# Patient Record
Sex: Female | Born: 1985 | Hispanic: Yes | Marital: Single | Smoking: Never smoker
Health system: Southern US, Community
[De-identification: ages and names within clinical notes are randomized; demographics above are authoritative.]

## PROBLEM LIST (undated history)

## (undated) DIAGNOSIS — E119 Type 2 diabetes mellitus without complications: Secondary | ICD-10-CM

## (undated) HISTORY — PX: APPENDECTOMY: SHX54

## (undated) HISTORY — PX: CHOLECYSTECTOMY: SHX55

---

## 2015-10-30 ENCOUNTER — Encounter: Payer: Self-pay | Admitting: Emergency Medicine

## 2015-10-30 ENCOUNTER — Emergency Department
Admission: EM | Admit: 2015-10-30 | Discharge: 2015-10-31 | Disposition: A | Discharge status: Home / Self Care | Payer: Self-pay | Attending: Emergency Medicine | Admitting: Emergency Medicine

## 2015-10-30 DIAGNOSIS — Z7984 Long term (current) use of oral hypoglycemic drugs: Secondary | ICD-10-CM | POA: Insufficient documentation

## 2015-10-30 DIAGNOSIS — R109 Unspecified abdominal pain: Secondary | ICD-10-CM

## 2015-10-30 DIAGNOSIS — R102 Pelvic and perineal pain: Secondary | ICD-10-CM

## 2015-10-30 DIAGNOSIS — E119 Type 2 diabetes mellitus without complications: Secondary | ICD-10-CM | POA: Insufficient documentation

## 2015-10-30 DIAGNOSIS — R112 Nausea with vomiting, unspecified: Secondary | ICD-10-CM | POA: Insufficient documentation

## 2015-10-30 DIAGNOSIS — R1030 Lower abdominal pain, unspecified: Secondary | ICD-10-CM | POA: Insufficient documentation

## 2015-10-30 DIAGNOSIS — R1010 Upper abdominal pain, unspecified: Secondary | ICD-10-CM | POA: Insufficient documentation

## 2015-10-30 DIAGNOSIS — R51 Headache: Secondary | ICD-10-CM | POA: Insufficient documentation

## 2015-10-30 HISTORY — DX: Type 2 diabetes mellitus without complications: E11.9

## 2015-10-30 LAB — COMPREHENSIVE METABOLIC PANEL
ALK PHOS: 71 U/L (ref 38–126)
ALT: 18 U/L (ref 14–54)
AST: 17 U/L (ref 15–41)
Albumin: 3.4 g/dL — ABNORMAL LOW (ref 3.5–5.0)
Anion gap: 3 — ABNORMAL LOW (ref 5–15)
BILIRUBIN TOTAL: 0.5 mg/dL (ref 0.3–1.2)
BUN: 11 mg/dL (ref 6–20)
CALCIUM: 9.1 mg/dL (ref 8.9–10.3)
CO2: 31 mmol/L (ref 22–32)
CREATININE: 0.69 mg/dL (ref 0.44–1.00)
Chloride: 104 mmol/L (ref 101–111)
Glucose, Bld: 104 mg/dL — ABNORMAL HIGH (ref 65–99)
Potassium: 3.8 mmol/L (ref 3.5–5.1)
Sodium: 138 mmol/L (ref 135–145)
Total Protein: 7.7 g/dL (ref 6.5–8.1)

## 2015-10-30 LAB — URINALYSIS COMPLETE WITH MICROSCOPIC (ARMC ONLY)
Bacteria, UA: NONE SEEN
Bilirubin Urine: NEGATIVE
Glucose, UA: NEGATIVE mg/dL
Hgb urine dipstick: NEGATIVE
KETONES UR: NEGATIVE mg/dL
Leukocytes, UA: NEGATIVE
Nitrite: NEGATIVE
PH: 6 (ref 5.0–8.0)
Protein, ur: NEGATIVE mg/dL
Specific Gravity, Urine: 1.021 (ref 1.005–1.030)

## 2015-10-30 LAB — LIPASE, BLOOD: Lipase: 23 U/L (ref 11–51)

## 2015-10-30 LAB — CBC
HCT: 39 % (ref 35.0–47.0)
Hemoglobin: 12.9 g/dL (ref 12.0–16.0)
MCH: 28.6 pg (ref 26.0–34.0)
MCHC: 33.1 g/dL (ref 32.0–36.0)
MCV: 86.2 fL (ref 80.0–100.0)
PLATELETS: 294 10*3/uL (ref 150–440)
RBC: 4.52 MIL/uL (ref 3.80–5.20)
RDW: 14 % (ref 11.5–14.5)
WBC: 14.2 10*3/uL — AB (ref 3.6–11.0)

## 2015-10-30 LAB — POCT PREGNANCY, URINE: Preg Test, Ur: NEGATIVE

## 2015-10-30 NOTE — ED Triage Notes (Signed)
Patient ambulatory to triage with steady gait, without difficulty or distress noted; pt reports lower abd pain with N/V x 3 days

## 2015-10-30 NOTE — ED Notes (Signed)
Pt reports she has abd pain for 3 days.  Pt vomited x 1 today.  No diarrhea.  No vag bleeding.  No fever.  No back pain.  Pt also has a headache.  Pt alert.  Speech clear.   md in with pt

## 2015-10-31 ENCOUNTER — Encounter: Payer: Self-pay | Admitting: Radiology

## 2015-10-31 ENCOUNTER — Emergency Department: Payer: Self-pay

## 2015-10-31 LAB — CHLAMYDIA/NGC RT PCR (ARMC ONLY)
CHLAMYDIA TR: NOT DETECTED
N GONORRHOEAE: NOT DETECTED

## 2015-10-31 MED ORDER — ACETAMINOPHEN 500 MG PO TABS: 1000.0000 mg | ORAL_TABLET | Freq: Once | ORAL | Status: DC

## 2015-10-31 MED ORDER — ONDANSETRON HCL 4 MG/2ML IJ SOLN
4.0000 mg | Freq: Once | INTRAMUSCULAR | Status: AC
  Administered 2015-10-31: 4 mg via INTRAVENOUS
  Filled 2015-10-31: qty 2

## 2015-10-31 MED ORDER — DEXTROSE 5 % IV SOLN
1.0000 g | Freq: Once | INTRAVENOUS | Status: AC
  Administered 2015-10-31: 1 g via INTRAVENOUS
  Filled 2015-10-31: qty 10

## 2015-10-31 MED ORDER — IOPAMIDOL (ISOVUE-300) INJECTION 61%
30.0000 mL | Freq: Once | INTRAVENOUS | Status: AC | PRN
  Administered 2015-10-31: 30 mL via ORAL

## 2015-10-31 MED ORDER — MORPHINE SULFATE (PF) 4 MG/ML IV SOLN
INTRAVENOUS | Status: AC
  Administered 2015-10-31: 4 mg
  Filled 2015-10-31: qty 1

## 2015-10-31 MED ORDER — DOXYCYCLINE HYCLATE 100 MG PO TABS
100.0000 mg | ORAL_TABLET | Freq: Once | ORAL | Status: AC
  Administered 2015-10-31: 100 mg via ORAL
  Filled 2015-10-31: qty 1

## 2015-10-31 MED ORDER — IOPAMIDOL (ISOVUE-300) INJECTION 61%
125.0000 mL | Freq: Once | INTRAVENOUS | Status: AC | PRN
  Administered 2015-10-31: 125 mL via INTRAVENOUS

## 2015-10-31 MED ORDER — GI COCKTAIL ~~LOC~~
30.0000 mL | Freq: Once | ORAL | Status: AC
  Administered 2015-10-31: 30 mL via ORAL
  Filled 2015-10-31: qty 30

## 2015-10-31 MED ORDER — MORPHINE SULFATE (PF) 4 MG/ML IV SOLN
4.0000 mg | Freq: Once | INTRAVENOUS | Status: AC
  Administered 2015-10-31: 4 mg via INTRAVENOUS
  Filled 2015-10-31: qty 1

## 2015-10-31 NOTE — ED Provider Notes (Signed)
[facility] Emergency Department Provider Note   ____________________________________________   First MD Initiated Contact with Patient 10/30/15 2352     (approximate)  I have reviewed the triage vital signs and the nursing notes.   HISTORY  Chief Complaint Abdominal Pain  HPI Pamela Neal is a 30 y.o. female patient complains of 3 days of nausea and vomiting and headache. She says her belly hurts in the upper abdomen and lower abdomen. It just aches. He denies any diarrhea. She denies any fever. She is not having any coughing. The pain is moderate to severe in nature nothing seems to make it better or worse although it is worse if I palpated.   Past Medical History:  Diagnosis Date  . Diabetes mellitus without complication (HCC)     There are no active problems to display for this patient.   Past Surgical History:  Procedure Laterality Date  . APPENDECTOMY    . CESAREAN SECTION    . CHOLECYSTECTOMY      Prior to Admission medications   Medication Sig Start Date End Date Taking? Authorizing Provider  metFORMIN (GLUCOPHAGE) 500 MG tablet Take 500 mg by mouth 2 (two) times daily with a meal.   Yes Historical Provider, MD    Allergies Review of patient's allergies indicates no known allergies.  No family history on file.  Social History Social History  Substance Use Topics  . Smoking status: Never Smoker  . Smokeless tobacco: Never Used  . Alcohol use No    Review of Systems Constitutional: No fever/chills Eyes: No visual changes. ENT: No sore throat. Cardiovascular: Denies chest pain. Respiratory: Denies shortness of breath. Gastrointestinal: See history of present illness Genitourinary: Negative for dysuria. Musculoskeletal: Negative for back pain. Skin: Negative for rash. Neurological: Negative for focal weakness or numbness.  10-point ROS otherwise negative.  ____________________________________________   PHYSICAL  EXAM:  VITAL SIGNS: ED Triage Vitals  Enc Vitals Group     BP 10/30/15 2118 121/68     Pulse Rate 10/30/15 2118 83     Resp 10/30/15 2118 (!) 22     Temp 10/30/15 2118 98.1 F (36.7 C)     Temp Source 10/30/15 2118 Oral     SpO2 10/30/15 2118 98 %     Weight 10/30/15 2116 287 lb (130.2 kg)     Height 10/30/15 2116 5\' 2"  (1.575 m)     Head Circumference --      Peak Flow --      Pain Score 10/30/15 2116 10     Pain Loc --      Pain Edu? --      Excl. in GC? --     Constitutional: Alert and oriented. Well appearing and in no acute distress. Eyes: Conjunctivae are normal. PERRL. EOMI. Head: Atraumatic. Nose: No congestion/rhinnorhea. Mouth/Throat: Mucous membranes are moist.  Oropharynx non-erythematous. Neck: No stridor.   Cardiovascular: Normal rate, regular rhythm. Grossly normal heart sounds.  Good peripheral circulation. Respiratory: Normal respiratory effort.  No retractions. Lungs CTAB. Gastrointestinal: Soft and Tender to palpation percussion in the upper abdomen and in the lower abdomen not as much in the middle abdomen.. Palpation reproduces her pain. No distention. No abdominal bruits. No CVA tenderness. Musculoskeletal: No lower extremity tenderness nor edema.  No joint effusions. Neurologic:  Normal speech and language. No gross focal neurologic deficits are appreciated. No gait instability. Skin:  Skin is warm, dry and intact. No rash noted. Psychiatric: Mood and affect are normal. Speech  and behavior are normal.  ____________________________________________   LABS (all labs ordered are listed, but only abnormal results are displayed)  Labs Reviewed  COMPREHENSIVE METABOLIC PANEL - Abnormal; Notable for the following:       Result Value   Glucose, Bld 104 (*)    Albumin 3.4 (*)    Anion gap 3 (*)    All other components within normal limits  CBC - Abnormal; Notable for the following:    WBC 14.2 (*)    All other components within normal limits  URINALYSIS  COMPLETEWITH MICROSCOPIC ([facility] ONLY) - Abnormal; Notable for the following:    Color, Urine YELLOW (*)    APPearance CLEAR (*)    Squamous Epithelial / LPF 0-5 (*)    All other components within normal limits  CHLAMYDIA/NGC RT PCR ([facility] ONLY)  LIPASE, BLOOD  POCT PREGNANCY, URINE   ____________________________________________  EKG   ____________________________________________  RADIOLOGY CLINICAL DATA:  30 year old female with abdominal pain.  EXAM: CT ABDOMEN AND PELVIS WITH CONTRAST  TECHNIQUE: Multidetector CT imaging of the abdomen and pelvis was performed using the standard protocol following bolus administration of intravenous contrast.  CONTRAST:  ISOVUE-300 IOPAMIDOL (ISOVUE-300) INJECTION 61%  COMPARISON:  None.  FINDINGS: Lower chest: The visualized lung bases are clear. No intra-abdominal free air or free fluid.  Hepatobiliary: Cholecystectomy. Diffuse fatty infiltration of the liver. No biliary ductal dilatation.  Pancreas: Unremarkable. No pancreatic ductal dilatation or surrounding inflammatory changes.  Spleen: Normal in size without focal abnormality.  Adrenals/Urinary Tract: Adrenal glands are unremarkable. Kidneys are normal, without renal calculi, focal lesion, or hydronephrosis. Bladder is unremarkable.  Stomach/Bowel: There is no evidence of bowel obstruction or active inflammation. Appendectomy.  Vascular/Lymphatic: No significant vascular findings are present. No enlarged abdominal or pelvic lymph nodes.  Reproductive: The uterus and ovaries are grossly unremarkable.  Other: Midline vertical anterior pelvic wall incisional scar. No fluid collection.  Musculoskeletal: No acute or significant osseous findings.  IMPRESSION: No acute intra-abdominal pelvic pathology.   Electronically Signed   By: Elgie Collard M.D.   On: 10/31/2015 01:57  _____________Study Result   CLINICAL DATA:   30 year old female with abdominal pain.  EXAM: TRANSABDOMINAL AND TRANSVAGINAL ULTRASOUND OF PELVIS  DOPPLER ULTRASOUND OF OVARIES  TECHNIQUE: Both transabdominal and transvaginal ultrasound examinations of the pelvis were performed. Transabdominal technique was performed for global imaging of the pelvis including uterus, ovaries, adnexal regions, and pelvic cul-de-sac.  It was necessary to proceed with endovaginal exam following the transabdominal exam to visualize the endometrium and ovaries. Color and duplex Doppler ultrasound was utilized to evaluate blood flow to the ovaries.  COMPARISON:  Abdominal CT dated 10/31/2015  FINDINGS: Uterus  Measurements: 9.6 x 3.9 x 6.2 cm. No fibroids or other mass visualized.  Endometrium  Thickness: 4 mm.  No focal abnormality visualized.  Right ovary  Measurements: 3.3 x 1.9 x 2.1 cm. Normal appearance/no adnexal mass.  Left ovary  Measurements: 3.1 x 1.7 x 2.3 cm. Normal appearance/no adnexal mass.  Pulsed Doppler evaluation of both ovaries demonstrates normal low-resistance arterial and venous waveforms.  Other findings  No abnormal free fluid.  IMPRESSION: Unremarkable pelvic ultrasound.   Electronically Signed   By: Elgie Collard M.D.   On: 10/31/2015 06:21   _______________________________   PROCEDURES  Procedure(s) performed\  Procedures  Critical Care performed:   ____________________________________________   INITIAL IMPRESSION / ASSESSMENT AND PLAN / ED COURSE  Pertinent labs & imaging results that were available during my care of  the patient were reviewed by me and considered in my medical decision making (see chart for details).   Clinical Course     ____________________________________________   FINAL CLINICAL IMPRESSION(S) / ED DIAGNOSES  Final diagnoses:  Abdominal pain, unspecified abdominal location      NEW MEDICATIONS STARTED DURING THIS VISIT:  New  Prescriptions   No medications on file     Note:  This document was prepared using Dragon voice recognition software and may include unintentional dictation errors.    Arnaldo NatalPaul F Baylynn Shifflett, MD 10/31/15 [phone]

## 2015-10-31 NOTE — ED Notes (Signed)
Pt finished po contrast  

## 2015-10-31 NOTE — ED Notes (Signed)
Iv started and meds given.  siderails up x 2.  

## 2015-10-31 NOTE — ED Notes (Signed)
Pt talking on cell phone.  Pt states feeling better.

## 2015-10-31 NOTE — Discharge Instructions (Signed)
Please return for worse pain fever vomiting or feeling sicker. I'm going to give you some Zantac 1 pill a day to help with the pain in the upper part of the abdomen. I will give you some Percocet one pill 4 times a day if needed for severe pain. Will give you some antibiotics doxycycline one pill twice a day. Be careful the doxycycline might make it easier for the sunburn. Make sure he drinks plenty of water and stay upright for at least 15 minutes after taking the doxycycline. Please follow-up with your family doctor or see the [facility] or [facility] or [facility] or [facility].

## 2015-10-31 NOTE — ED Notes (Signed)
Patient in ultrasound.

## 2015-10-31 NOTE — ED Notes (Signed)
Pt states pain is worse in abd.  After po contrast.  md aware.

## 2015-12-25 ENCOUNTER — Emergency Department: Payer: Self-pay

## 2015-12-25 ENCOUNTER — Emergency Department
Admission: EM | Admit: 2015-12-25 | Discharge: 2015-12-25 | Disposition: A | Discharge status: Home / Self Care | Payer: Self-pay | Attending: Emergency Medicine | Admitting: Emergency Medicine

## 2015-12-25 DIAGNOSIS — Y999 Unspecified external cause status: Secondary | ICD-10-CM | POA: Insufficient documentation

## 2015-12-25 DIAGNOSIS — Y929 Unspecified place or not applicable: Secondary | ICD-10-CM | POA: Insufficient documentation

## 2015-12-25 DIAGNOSIS — W010XXA Fall on same level from slipping, tripping and stumbling without subsequent striking against object, initial encounter: Secondary | ICD-10-CM | POA: Insufficient documentation

## 2015-12-25 DIAGNOSIS — Z7984 Long term (current) use of oral hypoglycemic drugs: Secondary | ICD-10-CM | POA: Insufficient documentation

## 2015-12-25 DIAGNOSIS — Y93E5 Activity, floor mopping and cleaning: Secondary | ICD-10-CM | POA: Insufficient documentation

## 2015-12-25 DIAGNOSIS — E119 Type 2 diabetes mellitus without complications: Secondary | ICD-10-CM | POA: Insufficient documentation

## 2015-12-25 DIAGNOSIS — S39012A Strain of muscle, fascia and tendon of lower back, initial encounter: Secondary | ICD-10-CM | POA: Insufficient documentation

## 2015-12-25 LAB — POCT PREGNANCY, URINE: PREG TEST UR: NEGATIVE

## 2015-12-25 MED ORDER — DIAZEPAM 2 MG PO TABS: 2.0000 mg | ORAL_TABLET | Freq: Three times a day (TID) | ORAL | 0 refills | Status: DC | PRN

## 2015-12-25 MED ORDER — HYDROCODONE-ACETAMINOPHEN 5-325 MG PO TABS
1.0000 | ORAL_TABLET | Freq: Once | ORAL | Status: AC
  Administered 2015-12-25: 1 via ORAL
  Filled 2015-12-25: qty 1

## 2015-12-25 MED ORDER — HYDROCODONE-ACETAMINOPHEN 5-325 MG PO TABS: 1.0000 | ORAL_TABLET | ORAL | 0 refills | Status: DC | PRN

## 2015-12-25 MED ORDER — DIAZEPAM 2 MG PO TABS
2.0000 mg | ORAL_TABLET | Freq: Once | ORAL | Status: AC
  Administered 2015-12-25: 2 mg via ORAL
  Filled 2015-12-25: qty 1

## 2015-12-25 MED ORDER — NAPROXEN 500 MG PO TABS
500.0000 mg | ORAL_TABLET | Freq: Once | ORAL | Status: AC
  Administered 2015-12-25: 500 mg via ORAL
  Filled 2015-12-25: qty 1

## 2015-12-25 MED ORDER — NAPROXEN 500 MG PO TABS: 500.0000 mg | ORAL_TABLET | Freq: Two times a day (BID) | ORAL | 0 refills | Status: DC

## 2015-12-25 NOTE — Discharge Instructions (Signed)
Take medication only as directed. Valium 2 mg 1 every 8 hours for the next 3 days for muscle spasms. Norco as needed for severe pain. Naproxen 500 mg twice a day with food as needed for pain and inflammation. Follow up with your primary care doctor if any continued problems in 4-5 days. You may use ice or heat to muscles as needed for comfort. Do not drive or operate machinery if taking these medications.

## 2015-12-25 NOTE — ED Triage Notes (Signed)
Pt came to ED via pov c/o back pain after mechanical fall x1 week ago. Reports taking tylenol with no relief.

## 2015-12-25 NOTE — ED Provider Notes (Signed)
[facility] Emergency Department Provider Note  ____________________________________________   First MD Initiated Contact with Patient 12/25/15 1010     (approximate)  I have reviewed the triage vital signs and the nursing notes.   HISTORY  Chief Complaint Back Pain   HPI Pamela Neal is a 30 y.o. female is here complaint of mechanical fall 1 week ago. Patient states that she had just mopped the floor and it was still wet and places. She slipped falling backwards directly onto her back. Patient has continued take ibuprofen without any relief. She denies any hematuria. She denies any previous back problems. She denies any paresthesias into her lower extremities, no incontinence of bowel or bladder. She has continued to ambulate although uncomfortable at times. She denies any head injury or loss consciousness. At the highest her pain has been a 10 out of 10.  Past Medical History:  Diagnosis Date  . Diabetes mellitus without complication (HCC)     There are no active problems to display for this patient.   Past Surgical History:  Procedure Laterality Date  . APPENDECTOMY    . CESAREAN SECTION    . CHOLECYSTECTOMY      Prior to Admission medications   Medication Sig Start Date End Date Taking? Authorizing Provider  diazepam (VALIUM) 2 MG tablet Take 1 tablet (2 mg total) by mouth every 8 (eight) hours as needed for muscle spasms. 12/25/15   Tommi Rumpshonda L Izabelle Daus, PA-C  HYDROcodone-acetaminophen (NORCO/VICODIN) 5-325 MG tablet Take 1 tablet by mouth every 4 (four) hours as needed for moderate pain. 12/25/15   Tommi Rumpshonda L Shaquana Buel, PA-C  metFORMIN (GLUCOPHAGE) 500 MG tablet Take 500 mg by mouth 2 (two) times daily with a meal.    Historical Provider, MD  naproxen (NAPROSYN) 500 MG tablet Take 1 tablet (500 mg total) by mouth 2 (two) times daily with a meal. 12/25/15   Tommi Rumpshonda L Tyresse Jayson, PA-C    Allergies Patient has no known allergies.  No family history on  file.  Social History Social History  Substance Use Topics  . Smoking status: Never Smoker  . Smokeless tobacco: Never Used  . Alcohol use No    Review of Systems Constitutional: No fever/chills Eyes: No visual changes. ENT: No trauma Cardiovascular: Denies chest pain. Respiratory: Denies shortness of breath. Gastrointestinal: No abdominal pain.  No nausea, no vomiting.   Genitourinary: Negative for dysuria. Musculoskeletal: Positive for back pain. Skin: Negative for rash. Neurological: Negative for headaches, focal weakness or numbness.  10-point ROS otherwise negative.  ____________________________________________   PHYSICAL EXAM:  VITAL SIGNS: ED Triage Vitals [12/25/15 0952]  Enc Vitals Group     BP 129/60     Pulse Rate 93     Resp 16     Temp 97.6 F (36.4 C)     Temp Source Oral     SpO2 98 %     Weight 287 lb (130.2 kg)     Height 5\' 2"  (1.575 m)     Head Circumference      Peak Flow      Pain Score      Pain Loc      Pain Edu?      Excl. in GC?     Constitutional: Alert and oriented. Well appearing and in no acute distress. Eyes: Conjunctivae are normal. PERRL. EOMI. Head: Atraumatic. Nose: No congestion/rhinnorhea. Mouth/Throat: Mucous membranes are moist.  Oropharynx non-erythematous. Neck: No stridor. No cervical tenderness on palpation posteriorly. Range of motion  is without restriction. Cardiovascular: Normal rate, regular rhythm. Grossly normal heart sounds.  Good peripheral circulation. Respiratory: Normal respiratory effort.  No retractions. Lungs CTAB. Gastrointestinal: Soft and nontender. No distention.  Musculoskeletal: Examination of the back there is no gross deformity however there is tenderness on palpation of the lower lumbar spine and paravertebral muscles bilaterally. Range of motion is slightly restricted secondary to discomfort. No active muscle spasms are seen. Straight leg raises were negative bilaterally. Slow guarded gait was  noted. Neurologic:  Normal speech and language. No gross focal neurologic deficits are appreciated. Reflexes 1+ bilaterally. No gait instability. Skin:  Skin is warm, dry and intact. No rash noted. Psychiatric: Mood and affect are normal. Speech and behavior are normal.  ____________________________________________   LABS (all labs ordered are listed, but only abnormal results are displayed)  Labs Reviewed  POC URINE PREG, ED  POCT PREGNANCY, URINE    RADIOLOGY  Lumbar spine x-ray per radiologist: IMPRESSION: 1. No acute osseous abnormality. 2. Straightening of the usual lordosis which may reflect positioning and/or spasm. 3. Mild degenerative disc disease at L5-S1. I, Tommi Rumpshonda L Maykayla Highley, personally viewed and evaluated these images (plain radiographs) as part of my medical decision making, as well as reviewing the written report by the radiologist. ____________________________________________   PROCEDURES  Procedure(s) performed: None  Procedures  Critical Care performed: No  ____________________________________________   INITIAL IMPRESSION / ASSESSMENT AND PLAN / ED COURSE  Pertinent labs & imaging results that were available during my care of the patient were reviewed by me and considered in my medical decision making (see chart for details).    Clinical Course    Discussed x-ray findings with patient. Patient was given a prescription for diazepam 2 mg 1 tablet every 8 hours for the next 3 days for muscle spasms. Norco as needed for severe pain and naproxen 500 mg twice a day with food for pain and inflammation. Patient is follow-up with her primary care doctor if any continued problems after 4-5 days. She will use ice or heat to her muscles as needed for comfort. She is aware that these medications could cause drowsiness increase her risk for falling.  ____________________________________________   FINAL CLINICAL IMPRESSION(S) / ED DIAGNOSES  Final diagnoses:    Acute myofascial strain of lumbar region, initial encounter      NEW MEDICATIONS STARTED DURING THIS VISIT:  Discharge Medication List as of 12/25/2015 12:51 PM    START taking these medications   Details  diazepam (VALIUM) 2 MG tablet Take 1 tablet (2 mg total) by mouth every 8 (eight) hours as needed for muscle spasms., Starting Thu 12/25/2015, Print    HYDROcodone-acetaminophen (NORCO/VICODIN) 5-325 MG tablet Take 1 tablet by mouth every 4 (four) hours as needed for moderate pain., Starting Thu 12/25/2015, Print    naproxen (NAPROSYN) 500 MG tablet Take 1 tablet (500 mg total) by mouth 2 (two) times daily with a meal., Starting Thu 12/25/2015, Print         Note:  This document was prepared using Dragon voice recognition software and may include unintentional dictation errors.    Tommi Rumpshonda L Kaliah Haddaway, PA-C 12/25/15 1429    Emily FilbertJonathan E Williams, MD 12/25/15 [phone]

## 2016-01-09 ENCOUNTER — Emergency Department
Admission: EM | Admit: 2016-01-09 | Discharge: 2016-01-09 | Disposition: A | Discharge status: Home / Self Care | Payer: Self-pay | Attending: Student in an Organized Health Care Education/Training Program | Admitting: Student in an Organized Health Care Education/Training Program

## 2016-01-09 DIAGNOSIS — E119 Type 2 diabetes mellitus without complications: Secondary | ICD-10-CM | POA: Insufficient documentation

## 2016-01-09 DIAGNOSIS — R112 Nausea with vomiting, unspecified: Secondary | ICD-10-CM | POA: Insufficient documentation

## 2016-01-09 DIAGNOSIS — R1084 Generalized abdominal pain: Secondary | ICD-10-CM | POA: Insufficient documentation

## 2016-01-09 DIAGNOSIS — R51 Headache: Secondary | ICD-10-CM | POA: Insufficient documentation

## 2016-01-09 DIAGNOSIS — Z7984 Long term (current) use of oral hypoglycemic drugs: Secondary | ICD-10-CM | POA: Insufficient documentation

## 2016-01-09 DIAGNOSIS — R519 Headache, unspecified: Secondary | ICD-10-CM

## 2016-01-09 LAB — COMPREHENSIVE METABOLIC PANEL
ALT: 18 U/L (ref 14–54)
ANION GAP: 5 (ref 5–15)
AST: 18 U/L (ref 15–41)
Albumin: 3.5 g/dL (ref 3.5–5.0)
Alkaline Phosphatase: 67 U/L (ref 38–126)
BUN: 10 mg/dL (ref 6–20)
CHLORIDE: 101 mmol/L (ref 101–111)
CO2: 30 mmol/L (ref 22–32)
Calcium: 9.1 mg/dL (ref 8.9–10.3)
Creatinine, Ser: 0.7 mg/dL (ref 0.44–1.00)
Glucose, Bld: 155 mg/dL — ABNORMAL HIGH (ref 65–99)
POTASSIUM: 4 mmol/L (ref 3.5–5.1)
Sodium: 136 mmol/L (ref 135–145)
Total Bilirubin: 0.1 mg/dL — ABNORMAL LOW (ref 0.3–1.2)
Total Protein: 7.4 g/dL (ref 6.5–8.1)

## 2016-01-09 LAB — URINALYSIS, COMPLETE (UACMP) WITH MICROSCOPIC
Bacteria, UA: NONE SEEN
Bilirubin Urine: NEGATIVE
GLUCOSE, UA: NEGATIVE mg/dL
Hgb urine dipstick: NEGATIVE
Ketones, ur: NEGATIVE mg/dL
Leukocytes, UA: NEGATIVE
NITRITE: NEGATIVE
PH: 6 (ref 5.0–8.0)
Protein, ur: NEGATIVE mg/dL
SPECIFIC GRAVITY, URINE: 1.019 (ref 1.005–1.030)

## 2016-01-09 LAB — LIPASE, BLOOD: LIPASE: 19 U/L (ref 11–51)

## 2016-01-09 LAB — POCT PREGNANCY, URINE: Preg Test, Ur: NEGATIVE

## 2016-01-09 LAB — CBC
HEMATOCRIT: 38.5 % (ref 35.0–47.0)
HEMOGLOBIN: 12.9 g/dL (ref 12.0–16.0)
MCH: 28.7 pg (ref 26.0–34.0)
MCHC: 33.4 g/dL (ref 32.0–36.0)
MCV: 85.8 fL (ref 80.0–100.0)
Platelets: 279 10*3/uL (ref 150–440)
RBC: 4.48 MIL/uL (ref 3.80–5.20)
RDW: 14.1 % (ref 11.5–14.5)
WBC: 11.7 10*3/uL — AB (ref 3.6–11.0)

## 2016-01-09 MED ORDER — DIPHENHYDRAMINE HCL 50 MG/ML IJ SOLN
25.0000 mg | Freq: Once | INTRAMUSCULAR | Status: AC
  Administered 2016-01-09: 25 mg via INTRAVENOUS
  Filled 2016-01-09: qty 1

## 2016-01-09 MED ORDER — PROCHLORPERAZINE EDISYLATE 5 MG/ML IJ SOLN
10.0000 mg | Freq: Once | INTRAMUSCULAR | Status: AC
  Administered 2016-01-09: 10 mg via INTRAVENOUS
  Filled 2016-01-09 (×2): qty 2

## 2016-01-09 MED ORDER — ACETAMINOPHEN 500 MG PO TABS
1000.0000 mg | ORAL_TABLET | Freq: Once | ORAL | Status: AC
  Administered 2016-01-09: 1000 mg via ORAL
  Filled 2016-01-09: qty 2

## 2016-01-09 MED ORDER — SODIUM CHLORIDE 0.9 % IV BOLUS (SEPSIS)
1000.0000 mL | Freq: Once | INTRAVENOUS | Status: AC
  Administered 2016-01-09: 1000 mL via INTRAVENOUS

## 2016-01-09 MED ORDER — PROMETHAZINE HCL 12.5 MG PO TABS: 12.5000 mg | ORAL_TABLET | Freq: Four times a day (QID) | ORAL | 0 refills | Status: AC | PRN

## 2016-01-09 NOTE — ED Notes (Signed)
Pt ambulatory to restroom without difficulty. Pt states nausea improved, headache improved but remains.

## 2016-01-09 NOTE — Discharge Instructions (Signed)

## 2016-01-09 NOTE — ED Triage Notes (Signed)
Pt came to ED c/o abdominal pain, nausea, headache starting 1 week ago. Denies fevers.

## 2016-01-09 NOTE — ED Notes (Signed)
Pt reports generalized abdominal pain that is more concentrated in lower quadrants wrapping around to bilateral flanks. Pt states she is dizzy when she stands up and she has a generalized headache. Skin normal color, dry, resps unlabored. Pt states the symptoms hit her suddenly while she was shopping on Monday. Pt states she has had 6 episodes of emesis since Monday and has not had diarrhea. Pt denies known fever, denies chills.

## 2016-01-09 NOTE — ED Provider Notes (Addendum)
[facility] Emergency Department Provider Note    First MD Initiated Contact with Patient 01/09/16 1909     (approximate)  I have reviewed the triage vital signs and the nursing notes.   HISTORY  Chief Complaint Abdominal Pain and Emesis    HPI Pamela Neal is a 30 y.o. female  with a history of diabetes as well as recurrent migraines presents with generalized abdominal pain associated with nausea and vomiting for 1 week. Patient states that she has been feeling dizzy when she stands up and has had a generalized headache that is worsened over the past 3 days. No neck stiffness. No fevers. No chest pain or shortness of breath. Patient has artery had her gallbladder removed and artery had an appendectomy. She denies any dysuria. No pelvic pain. No flank pain. Any diarrhea.   Past Medical History:  Diagnosis Date  . Diabetes mellitus without complication (HCC)    No family history on file. Past Surgical History:  Procedure Laterality Date  . APPENDECTOMY    . CESAREAN SECTION    . CHOLECYSTECTOMY     There are no active problems to display for this patient.     Prior to Admission medications   Medication Sig Start Date End Date Taking? Authorizing Provider  diazepam (VALIUM) 2 MG tablet Take 1 tablet (2 mg total) by mouth every 8 (eight) hours as needed for muscle spasms. 12/25/15   Tommi Rumps, PA-C  HYDROcodone-acetaminophen (NORCO/VICODIN) 5-325 MG tablet Take 1 tablet by mouth every 4 (four) hours as needed for moderate pain. 12/25/15   Tommi Rumps, PA-C  metFORMIN (GLUCOPHAGE) 500 MG tablet Take 500 mg by mouth 2 (two) times daily with a meal.    Historical Provider, MD  naproxen (NAPROSYN) 500 MG tablet Take 1 tablet (500 mg total) by mouth 2 (two) times daily with a meal. 12/25/15   Tommi Rumps, PA-C  promethazine (PHENERGAN) 12.5 MG tablet Take 1 tablet (12.5 mg total) by mouth every 6 (six) hours as needed for nausea or vomiting.  01/09/16   Willy Eddy, MD    Allergies Patient has no known allergies.    Social History Social History  Substance Use Topics  . Smoking status: Never Smoker  . Smokeless tobacco: Never Used  . Alcohol use No    Review of Systems Patient denies headaches, rhinorrhea, blurry vision, numbness, shortness of breath, chest pain, edema, cough, abdominal pain, nausea, vomiting, diarrhea, dysuria, fevers, rashes or hallucinations unless otherwise stated above in HPI. ____________________________________________   PHYSICAL EXAM:  VITAL SIGNS: Vitals:   01/09/16 1857 01/09/16 1858  BP: (!) 154/92 (!) 154/92  Pulse: 96 98  Resp: 16 16  Temp: 97.9 F (36.6 C) 97.9 F (36.6 C)    Constitutional: obese, Alert and oriented. in no acute distress. Eyes: Conjunctivae are normal. PERRL. EOMI. Head: Atraumatic. Nose: No congestion/rhinnorhea. Mouth/Throat: Mucous membranes are moist.  Oropharynx non-erythematous. Neck: No stridor. Painless ROM. No cervical spine tenderness to palpation Hematological/Lymphatic/Immunilogical: No cervical lymphadenopathy. Cardiovascular: Normal rate, regular rhythm. Grossly normal heart sounds.  Good peripheral circulation. Respiratory: Normal respiratory effort.  No retractions. Lungs CTAB. Gastrointestinal: obese, Soft and nontender. No distention. Normoactive bowel sounds.  No abdominal bruits. No CVA tenderness. Musculoskeletal: No lower extremity tenderness nor edema.  No joint effusions. Neurologic:  Normal speech and language. No gross focal neurologic deficits are appreciated. No gait instability. Skin:  Skin is warm, dry and intact. No rash noted. Psychiatric: Mood and affect  are normal. Speech and behavior are normal.  ____________________________________________   LABS (all labs ordered are listed, but only abnormal results are displayed)  Results for orders placed or performed during the hospital encounter of 01/09/16 (from the past 24  hour(s))  Lipase, blood     Status: None   Collection Time: 01/09/16  6:58 PM  Result Value Ref Range   Lipase 19 11 - 51 U/L  Comprehensive metabolic panel     Status: Abnormal   Collection Time: 01/09/16  6:58 PM  Result Value Ref Range   Sodium 136 135 - 145 mmol/L   Potassium 4.0 3.5 - 5.1 mmol/L   Chloride 101 101 - 111 mmol/L   CO2 30 22 - 32 mmol/L   Glucose, Bld 155 (H) 65 - 99 mg/dL   BUN 10 6 - 20 mg/dL   Creatinine, Ser 1.610.70 0.44 - 1.00 mg/dL   Calcium 9.1 8.9 - 09.610.3 mg/dL   Total Protein 7.4 6.5 - 8.1 g/dL   Albumin 3.5 3.5 - 5.0 g/dL   AST 18 15 - 41 U/L   ALT 18 14 - 54 U/L   Alkaline Phosphatase 67 38 - 126 U/L   Total Bilirubin 0.1 (L) 0.3 - 1.2 mg/dL   GFR calc non Af Amer >60 >60 mL/min   GFR calc Af Amer >60 >60 mL/min   Anion gap 5 5 - 15  CBC     Status: Abnormal   Collection Time: 01/09/16  6:58 PM  Result Value Ref Range   WBC 11.7 (H) 3.6 - 11.0 K/uL   RBC 4.48 3.80 - 5.20 MIL/uL   Hemoglobin 12.9 12.0 - 16.0 g/dL   HCT 04.538.5 40.935.0 - 81.147.0 %   MCV 85.8 80.0 - 100.0 fL   MCH 28.7 26.0 - 34.0 pg   MCHC 33.4 32.0 - 36.0 g/dL   RDW 91.414.1 78.211.5 - 95.614.5 %   Platelets 279 150 - 440 K/uL  Urinalysis, Complete w Microscopic     Status: Abnormal   Collection Time: 01/09/16  6:58 PM  Result Value Ref Range   Color, Urine YELLOW (A) YELLOW   APPearance CLEAR (A) CLEAR   Specific Gravity, Urine 1.019 1.005 - 1.030   pH 6.0 5.0 - 8.0   Glucose, UA NEGATIVE NEGATIVE mg/dL   Hgb urine dipstick NEGATIVE NEGATIVE   Bilirubin Urine NEGATIVE NEGATIVE   Ketones, ur NEGATIVE NEGATIVE mg/dL   Protein, ur NEGATIVE NEGATIVE mg/dL   Nitrite NEGATIVE NEGATIVE   Leukocytes, UA NEGATIVE NEGATIVE   RBC / HPF 0-5 0 - 5 RBC/hpf   WBC, UA 0-5 0 - 5 WBC/hpf   Bacteria, UA NONE SEEN NONE SEEN   Squamous Epithelial / LPF 0-5 (A) NONE SEEN   Mucous PRESENT   Pregnancy, urine POC     Status: None   Collection Time: 01/09/16  7:03 PM  Result Value Ref Range   Preg Test, Ur  NEGATIVE NEGATIVE   ____________________________________________  ____________________________________________  RADIOLOGY   ____________________________________________   PROCEDURES  Procedure(s) performed: none Procedures    Critical Care performed: no ____________________________________________   INITIAL IMPRESSION / ASSESSMENT AND PLAN / ED COURSE  Pertinent labs & imaging results that were available during my care of the patient were reviewed by me and considered in my medical decision making (see chart for details).  DDX: AGE, flu, gastritis, migraine, tension Ha, dehydration, uti, pregnancy  Art Buffna Lampkins is a 30 y.o. who presents to the ED with above complaints.  Patient is AFVSS in ED. Exam as above. Given current presentation have considered the above differential. Is not the worse headache of her life. It is gradual in onset and she's had multiple headaches similar to this in the past. Her abdominal exam is soft and benign. Do not feel CT imaging clinically indicated at this time she started status post appendectomy and cholecystectomy. Has a mild leukocytosis on blood work, but no evidence of metabolic acidosis with significant hyperglycemia. Urinalysis shows no evidence urinary infection. There is no evidence of pregnancy. No evidence of pneumonia clinically. Patient provided IV fluids for dehydration with resolution of her dizziness. Headache resolved after migraine cocktail. Patient able to ambulate with a steady gait to the restroom. She is able to tolerate oral hydration. Will discharge with conservative management and follow-up with PCP.  Clinical Course      ____________________________________________   FINAL CLINICAL IMPRESSION(S) / ED DIAGNOSES  Final diagnoses:  Non-intractable vomiting with nausea, unspecified vomiting type  Acute nonintractable headache, unspecified headache type  Generalized abdominal pain      NEW MEDICATIONS STARTED DURING  THIS VISIT:  New Prescriptions   PROMETHAZINE (PHENERGAN) 12.5 MG TABLET    Take 1 tablet (12.5 mg total) by mouth every 6 (six) hours as needed for nausea or vomiting.     Note:  This document was prepared using Dragon voice recognition software and may include unintentional dictation errors.    Willy EddyPatrick Adah Stoneberg, MD 01/09/16 2129    Willy EddyPatrick Jenniah Bhavsar, MD 01/09/16 2130

## 2016-06-29 ENCOUNTER — Emergency Department: Payer: Self-pay

## 2016-06-29 ENCOUNTER — Emergency Department
Admission: EM | Admit: 2016-06-29 | Discharge: 2016-06-30 | Disposition: A | Discharge status: Home / Self Care | Payer: Self-pay | Attending: Emergency Medicine | Admitting: Emergency Medicine

## 2016-06-29 DIAGNOSIS — E119 Type 2 diabetes mellitus without complications: Secondary | ICD-10-CM | POA: Insufficient documentation

## 2016-06-29 DIAGNOSIS — R102 Pelvic and perineal pain: Secondary | ICD-10-CM | POA: Insufficient documentation

## 2016-06-29 DIAGNOSIS — Z9049 Acquired absence of other specified parts of digestive tract: Secondary | ICD-10-CM | POA: Insufficient documentation

## 2016-06-29 DIAGNOSIS — Z7984 Long term (current) use of oral hypoglycemic drugs: Secondary | ICD-10-CM | POA: Insufficient documentation

## 2016-06-29 NOTE — ED Notes (Signed)
PT in US at this time  

## 2016-06-29 NOTE — ED Provider Notes (Signed)
[facility] Emergency Department Provider Note   First MD Initiated Contact with Patient 06/29/16 2307     (approximate)  I have reviewed the triage vital signs and the nursing notes.   HISTORY  Chief Complaint Altered Mental Status    HPI Pamela Neal is a 31 y.o. female with history of diabetes and ovarian cyst presents to the emergency department with history of taking 5 oxycodone tablets approximately 20 minutes before arrival to the emergency department. Patient states that she did so secondary to pelvic discomfort. Patient does admit to having a verbal altercation with her husband before taking the pills however she adamantly states that she "was not trying to kill herself". Patient states that she was diagnosed with ovarian cyst by her primary care provider however ultrasound has not been performed to date.   Past Medical History:  Diagnosis Date  . Diabetes mellitus without complication (HCC)     There are no active problems to display for this patient.   Past Surgical History:  Procedure Laterality Date  . APPENDECTOMY    . CESAREAN SECTION    . CHOLECYSTECTOMY      Prior to Admission medications   Medication Sig Start Date End Date Taking? Authorizing Provider  metFORMIN (GLUCOPHAGE) 500 MG tablet Take 500 mg by mouth 2 (two) times daily with a meal.   Yes [provider]  promethazine (PHENERGAN) 12.5 MG tablet Take 1 tablet (12.5 mg total) by mouth every 6 (six) hours as needed for nausea or vomiting. Patient not taking: Reported on 06/29/2016 01/09/16   Willy Eddyobinson, Patrick, MD    Allergies No known drug allergies  Family history Noncontributory  Social History Social History  Substance Use Topics  . Smoking status: Never Smoker  . Smokeless tobacco: Never Used  . Alcohol use No    Review of Systems Constitutional: No fever/chills Eyes: No visual changes. ENT: No sore throat. Cardiovascular: Denies chest  pain. Respiratory: Denies shortness of breath. Gastrointestinal: No abdominal pain.  No nausea, no vomiting.  No diarrhea.  No constipation. Genitourinary: Negative for dysuria.Positive for pelvic pain Musculoskeletal: Negative for neck pain.  Negative for back pain. Integumentary: Negative for rash. Neurological: Negative for headaches, focal weakness or numbness.  ____________________________________________   PHYSICAL EXAM:  VITAL SIGNS: ED Triage Vitals  Enc Vitals Group     BP 06/29/16 2245 (!) 170/154     Pulse Rate 06/29/16 2245 (!) 125     Resp 06/29/16 2245 (!) 24     Temp --      Temp src --      SpO2 06/29/16 2245 97 %     Weight 06/29/16 2247 136.1 kg (300 lb)     Height 06/29/16 2247 1.575 m (5\' 2" )     Head Circumference --      Peak Flow --      Pain Score --      Pain Loc --      Pain Edu? --      Excl. in GC? --     Constitutional: Alert and oriented. Well appearing and in no acute distress. Eyes: Conjunctivae are normal.  Head: Atraumatic. Mouth/Throat: Mucous membranes are moist.  Oropharynx non-erythematous. Neck: No stridor.   Cardiovascular: Normal rate, regular rhythm. Good peripheral circulation. Grossly normal heart sounds. Respiratory: Normal respiratory effort.  No retractions. Lungs CTAB. Gastrointestinal:Right lower quadrant/left lower quadrant tenderness to palpation. No distention.  Genitourinary:  Musculoskeletal: No lower extremity tenderness nor edema. No gross  deformities of extremities. Neurologic:  Normal speech and language. No gross focal neurologic deficits are appreciated.  Skin:  Skin is warm, dry and intact. No rash noted. Psychiatric: Mood and affect are normal. Speech and behavior are normal.  ____________________________________________   LABS (all labs ordered are listed, but only abnormal results are displayed)  Labs Reviewed  CBC - Abnormal; Notable for the following:       Result Value   WBC 11.9 (*)     Hemoglobin 11.9 (*)    All other components within normal limits  COMPREHENSIVE METABOLIC PANEL - Abnormal; Notable for the following:    Glucose, Bld 144 (*)    Calcium 8.5 (*)    Albumin 3.1 (*)    All other components within normal limits   _________________  RADIOLOGY I, Ruma N Jasmain Ahlberg, personally viewed and evaluated these images (plain radiographs) as part of my medical decision making, as well as reviewing the written report by the radiologist.  CLINICAL DATA:  31 year old female with lower abdominal pain.  EXAM: CT ABDOMEN AND PELVIS WITH CONTRAST  TECHNIQUE: Multidetector CT imaging of the abdomen and pelvis was performed using the standard protocol following bolus administration of intravenous contrast.  CONTRAST:  ISOVUE-300 IOPAMIDOL (ISOVUE-300) INJECTION 61%  COMPARISON:  Pelvic ultrasound dated 06/29/2016 and CT of the abdomen pelvis dated 10/31/2015  FINDINGS: Lower chest: The visualized lung bases are clear.  No intra-abdominal free air or free fluid.  Hepatobiliary: There is diffuse fatty infiltration of the liver. No intrahepatic biliary ductal dilatation. Cholecystectomy.  Pancreas: Unremarkable. No pancreatic ductal dilatation or surrounding inflammatory changes.  Spleen: Normal in size without focal abnormality.  Adrenals/Urinary Tract: The adrenal glands are unremarkable. There is a 1.4 x 2.0 cm complex partially exophytic lesion in the lateral interpolar aspect of the right kidney which is not well characterized on the CT. This appears slightly increased in size compared to the prior study and likely represents a complex cyst given patient's age. Further evaluation with abdominal ultrasound or MRI is recommended. There are two nonobstructing stones in the right kidney measuring up to 3 mm in the inferior pole. There is no hydronephrosis. The left kidney is unremarkable. The visualized ureters appear unremarkable. The urinary  bladder is collapsed.  Stomach/Bowel: There is no evidence of bowel obstruction or active inflammation. Appendectomy.  Vascular/Lymphatic: No significant vascular findings are present. No enlarged abdominal or pelvic lymph nodes.  Reproductive: The uterus is anteverted and grossly unremarkable. The ovaries appear unremarkable as well. No pelvic mass.  Other: Mild diffuse subcutaneous stranding.  No fluid collection.  Musculoskeletal: No acute or significant osseous findings.  IMPRESSION: 1. No bowel obstruction or active inflammation. 2. Fatty liver. 3. Small nonobstructing right renal calculi.  No hydronephrosis. 4. A partially exophytic mixed attenuating lesion from the lateral interpolar aspect of the right kidney is not well characterized but may represent a complex cyst. Further characterization with ultrasound or MRI recommended.   Electronically Signed   By: Elgie Collard M.D.   On: 06/30/2016 03:53   CLINICAL DATA:  Subacute onset of pelvic pain.  Initial encounter.  EXAM: TRANSABDOMINAL AND TRANSVAGINAL ULTRASOUND OF PELVIS  TECHNIQUE: Both transabdominal and transvaginal ultrasound examinations of the pelvis were performed. Transabdominal technique was performed for global imaging of the pelvis including uterus, ovaries, adnexal regions, and pelvic cul-de-sac. It was necessary to proceed with endovaginal exam following the transabdominal exam to visualize the uterus and ovaries in greater detail.  COMPARISON:  Pelvic ultrasound performed  10/31/2015  FINDINGS: Uterus  Measurements: 10.7 x 4.6 x 6.5 cm. No fibroids or other mass visualized.  Endometrium  Thickness: 0.8 cm.  No focal abnormality visualized.  Right ovary  Measurements: 3.0 x 2.2 x 2.2 cm. Normal appearance/no adnexal mass.  Left ovary  Measurements: 2.5 x 1.5 x 2.0 cm. Normal appearance/no adnexal mass.  Other findings  No abnormal free  fluid.  IMPRESSION: Unremarkable pelvic ultrasound. No evidence for ovarian torsion, though color Doppler blood flow is difficult to fully assess due to the patient's habitus. The patient could not tolerate transvaginal imaging.   Electronically Signed   By: Roanna Raider M.D.   On: 06/30/2016 00:47    Procedures   ____________________________________________   INITIAL IMPRESSION / ASSESSMENT AND PLAN / ED COURSE  Pertinent labs & imaging results that were available during my care of the patient were reviewed by me and considered in my medical decision making (see chart for details).        ____________________________________________  FINAL CLINICAL IMPRESSION(S) / ED DIAGNOSES  Final diagnoses:  Pelvic pain     MEDICATIONS GIVEN DURING THIS VISIT:  Medications  ketorolac (TORADOL) 30 MG/ML injection 30 mg (30 mg Intravenous Given 06/30/16 0138)  iopamidol (ISOVUE-300) 61 % injection 125 mL (125 mLs Intravenous Contrast Given 06/30/16 0329)     NEW OUTPATIENT MEDICATIONS STARTED DURING THIS VISIT:  Discharge Medication List as of 06/30/2016  3:58 AM      Discharge Medication List as of 06/30/2016  3:58 AM      Discharge Medication List as of 06/30/2016  3:58 AM       Note:  This document was prepared using Dragon voice recognition software and may include unintentional dictation errors.    Darci Current, MD 07/01/16 [phone]

## 2016-06-29 NOTE — ED Triage Notes (Signed)
Pt in with co anxiety, pt hyperventilating in triage at this time. Pt states she took 5 oxycodone prior to arrival stating "I am hurting because I am on my period". Pt had been arguing with her boyfriend today, pt denies any SI but does not giving a lot of answers when asked.

## 2016-06-30 ENCOUNTER — Emergency Department: Payer: Self-pay

## 2016-06-30 LAB — COMPREHENSIVE METABOLIC PANEL
ALT: 20 U/L (ref 14–54)
AST: 22 U/L (ref 15–41)
Albumin: 3.1 g/dL — ABNORMAL LOW (ref 3.5–5.0)
Alkaline Phosphatase: 62 U/L (ref 38–126)
Anion gap: 8 (ref 5–15)
BILIRUBIN TOTAL: 0.5 mg/dL (ref 0.3–1.2)
BUN: 15 mg/dL (ref 6–20)
CHLORIDE: 104 mmol/L (ref 101–111)
CO2: 26 mmol/L (ref 22–32)
CREATININE: 0.57 mg/dL (ref 0.44–1.00)
Calcium: 8.5 mg/dL — ABNORMAL LOW (ref 8.9–10.3)
Glucose, Bld: 144 mg/dL — ABNORMAL HIGH (ref 65–99)
Potassium: 3.7 mmol/L (ref 3.5–5.1)
Sodium: 138 mmol/L (ref 135–145)
TOTAL PROTEIN: 6.9 g/dL (ref 6.5–8.1)

## 2016-06-30 LAB — CBC
HCT: 35.8 % (ref 35.0–47.0)
Hemoglobin: 11.9 g/dL — ABNORMAL LOW (ref 12.0–16.0)
MCH: 28.4 pg (ref 26.0–34.0)
MCHC: 33.3 g/dL (ref 32.0–36.0)
MCV: 85.2 fL (ref 80.0–100.0)
PLATELETS: 270 10*3/uL (ref 150–440)
RBC: 4.21 MIL/uL (ref 3.80–5.20)
RDW: 13.8 % (ref 11.5–14.5)
WBC: 11.9 10*3/uL — ABNORMAL HIGH (ref 3.6–11.0)

## 2016-06-30 MED ORDER — IOPAMIDOL (ISOVUE-300) INJECTION 61%
125.0000 mL | Freq: Once | INTRAVENOUS | Status: AC | PRN
  Administered 2016-06-30: 125 mL via INTRAVENOUS

## 2016-06-30 MED ORDER — KETOROLAC TROMETHAMINE 30 MG/ML IJ SOLN
30.0000 mg | Freq: Once | INTRAMUSCULAR | Status: AC
  Administered 2016-06-30: 30 mg via INTRAVENOUS

## 2016-06-30 MED ORDER — KETOROLAC TROMETHAMINE 30 MG/ML IJ SOLN
INTRAMUSCULAR | Status: AC
  Administered 2016-06-30: 30 mg via INTRAVENOUS
  Filled 2016-06-30: qty 1

## 2016-06-30 NOTE — ED Notes (Signed)
Pt returned from CT at this time.  

## 2016-06-30 NOTE — ED Notes (Signed)
PT states pain getting worse at this time.

## 2016-06-30 NOTE — ED Notes (Signed)
Pt returned from US at this time.

## 2016-06-30 NOTE — ED Notes (Signed)
MD Manson PasseyBrown and this RN at bedside with update.

## 2016-06-30 NOTE — ED Notes (Signed)
Pt in CT at this time.

## 2016-06-30 NOTE — ED Notes (Signed)
Pt. Verbalizes understanding of d/c instructions and follow-up. VS stable.  Pt. In NAD at time of d/c and denies further concerns regarding this visit. Pt. Stable at the time of departure from the unit, departing unit by the safest and most appropriate manner per that pt condition and limitations. Pt advised to return to the ED at any time for emergent concerns, or for new/worsening symptoms.   

## 2017-02-07 ENCOUNTER — Other Ambulatory Visit: Payer: Self-pay

## 2017-02-07 ENCOUNTER — Emergency Department
Admission: EM | Admit: 2017-02-07 | Discharge: 2017-02-07 | Disposition: A | Discharge status: Home / Self Care | Payer: Self-pay | Attending: Emergency Medicine | Admitting: Emergency Medicine

## 2017-02-07 ENCOUNTER — Emergency Department: Payer: Self-pay

## 2017-02-07 ENCOUNTER — Encounter: Payer: Self-pay | Admitting: Emergency Medicine

## 2017-02-07 DIAGNOSIS — R109 Unspecified abdominal pain: Secondary | ICD-10-CM

## 2017-02-07 DIAGNOSIS — Z7984 Long term (current) use of oral hypoglycemic drugs: Secondary | ICD-10-CM | POA: Insufficient documentation

## 2017-02-07 DIAGNOSIS — E119 Type 2 diabetes mellitus without complications: Secondary | ICD-10-CM | POA: Insufficient documentation

## 2017-02-07 DIAGNOSIS — R102 Pelvic and perineal pain: Secondary | ICD-10-CM | POA: Insufficient documentation

## 2017-02-07 DIAGNOSIS — R1030 Lower abdominal pain, unspecified: Secondary | ICD-10-CM

## 2017-02-07 DIAGNOSIS — R1032 Left lower quadrant pain: Secondary | ICD-10-CM | POA: Insufficient documentation

## 2017-02-07 LAB — COMPREHENSIVE METABOLIC PANEL
ALK PHOS: 78 U/L (ref 38–126)
ALT: 21 U/L (ref 14–54)
ANION GAP: 7 (ref 5–15)
AST: 32 U/L (ref 15–41)
Albumin: 3.4 g/dL — ABNORMAL LOW (ref 3.5–5.0)
BILIRUBIN TOTAL: 0.7 mg/dL (ref 0.3–1.2)
BUN: 8 mg/dL (ref 6–20)
CALCIUM: 8.9 mg/dL (ref 8.9–10.3)
CO2: 27 mmol/L (ref 22–32)
Chloride: 102 mmol/L (ref 101–111)
Creatinine, Ser: 0.53 mg/dL (ref 0.44–1.00)
GLUCOSE: 212 mg/dL — AB (ref 65–99)
Potassium: 4 mmol/L (ref 3.5–5.1)
Sodium: 136 mmol/L (ref 135–145)
TOTAL PROTEIN: 7.6 g/dL (ref 6.5–8.1)

## 2017-02-07 LAB — URINALYSIS, COMPLETE (UACMP) WITH MICROSCOPIC
Bilirubin Urine: NEGATIVE
GLUCOSE, UA: NEGATIVE mg/dL
HGB URINE DIPSTICK: NEGATIVE
Ketones, ur: NEGATIVE mg/dL
LEUKOCYTES UA: NEGATIVE
NITRITE: NEGATIVE
PROTEIN: NEGATIVE mg/dL
Specific Gravity, Urine: 1.019 (ref 1.005–1.030)
pH: 5 (ref 5.0–8.0)

## 2017-02-07 LAB — WET PREP, GENITAL
CLUE CELLS WET PREP: NONE SEEN
Sperm: NONE SEEN
Trich, Wet Prep: NONE SEEN
WBC WET PREP: NONE SEEN
Yeast Wet Prep HPF POC: NONE SEEN

## 2017-02-07 LAB — CBC
HCT: 39.2 % (ref 35.0–47.0)
Hemoglobin: 13 g/dL (ref 12.0–16.0)
MCH: 28.8 pg (ref 26.0–34.0)
MCHC: 33.2 g/dL (ref 32.0–36.0)
MCV: 87 fL (ref 80.0–100.0)
Platelets: 268 10*3/uL (ref 150–440)
RBC: 4.51 MIL/uL (ref 3.80–5.20)
RDW: 14 % (ref 11.5–14.5)
WBC: 10.3 10*3/uL (ref 3.6–11.0)

## 2017-02-07 LAB — CHLAMYDIA/NGC RT PCR (ARMC ONLY)
CHLAMYDIA TR: NOT DETECTED
N GONORRHOEAE: NOT DETECTED

## 2017-02-07 LAB — POCT PREGNANCY, URINE: Preg Test, Ur: NEGATIVE

## 2017-02-07 LAB — LIPASE, BLOOD: Lipase: 18 U/L (ref 11–51)

## 2017-02-07 MED ORDER — NAPROXEN 375 MG PO TABS: 375.0000 mg | ORAL_TABLET | Freq: Two times a day (BID) | ORAL | 0 refills | Status: AC

## 2017-02-07 MED ORDER — ONDANSETRON HCL 4 MG/2ML IJ SOLN
4.0000 mg | Freq: Once | INTRAMUSCULAR | Status: AC
  Administered 2017-02-07: 4 mg via INTRAVENOUS
  Filled 2017-02-07: qty 2

## 2017-02-07 MED ORDER — MORPHINE SULFATE (PF) 4 MG/ML IV SOLN
4.0000 mg | Freq: Once | INTRAVENOUS | Status: AC
Start: 2017-02-07 — End: 2017-02-07
  Administered 2017-02-07: 4 mg via INTRAVENOUS
  Filled 2017-02-07: qty 1

## 2017-02-07 NOTE — ED Provider Notes (Signed)
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  We will discharge her with pain control for her chronic abdominal pain, and she will follow closely up as an outpatient we have given her extensive return precautions and her workup is very reassuring here.  Considering the patient's symptoms, medical history, and physical examination today, I have low suspicion for cholecystitis or biliary pathology, pancreatitis, perforation or bowel obstruction, hernia, intra-abdominal abscess, AAA or dissection, volvulus or intussusception, mesenteric ischemia, ischemic gut, pyelonephritis or appendicitis.  Patient notes that she has a mass on her kidney and is to follow-up   Jeanmarie PlantMcShane, Chandlor Noecker A, MD 02/07/17 [phone]

## 2017-02-07 NOTE — ED Triage Notes (Signed)
Lower abd pain x 2 days.  

## 2017-02-07 NOTE — ED Notes (Signed)
E-signature box not working. Pt verbalized understanding of discharge instructions and denied questions. 

## 2017-02-07 NOTE — ED Notes (Signed)
Patient transported to CT 

## 2017-02-07 NOTE — Discharge Instructions (Signed)
Return to the emergency room for any new or worrisome symptoms follow closely with your primary care doctor.  We also noticed that you have had for some time a small mass on your right kidney, it is unclear what this is but it does not appear to be changing over time.  Nonetheless you do require an outpatient MRI for this in the next few days..  Please talk to your primary care doctor about it.

## 2017-02-07 NOTE — ED Provider Notes (Signed)
[facility] Emergency Department Provider Note  Time seen: 1:54 PM  I have reviewed the triage vital signs and the nursing notes.   HISTORY  Chief Complaint Abdominal Pain    HPI Pamela Neal is a 32 y.o. female with a past medical history of diabetes, obesity, presents to the emergency department for lower abdominal pain.  According to the patient for the past 2 days she has been experiencing moderate lower abdominal pain especially in the lower abdomen and left lower abdomen/pelvis.  Denies any fever, nausea, vomiting, diarrhea, dysuria or hematuria.  Denies vaginal bleeding or discharge.  Describes her pain as moderate, aching pain.  Patient does state a history of ovarian cyst in the past which felt very similar to today's presentation.  But states this episode is worse.   Past Medical History:  Diagnosis Date  . Diabetes mellitus without complication (HCC)     There are no active problems to display for this patient.   Past Surgical History:  Procedure Laterality Date  . APPENDECTOMY    . CESAREAN SECTION    . CHOLECYSTECTOMY      Prior to Admission medications   Medication Sig Start Date End Date Taking? Authorizing Provider  metFORMIN (GLUCOPHAGE) 500 MG tablet Take 500 mg by mouth 2 (two) times daily with a meal.    [provider]  promethazine (PHENERGAN) 12.5 MG tablet Take 1 tablet (12.5 mg total) by mouth every 6 (six) hours as needed for nausea or vomiting. Patient not taking: Reported on 06/29/2016 01/09/16   Willy Eddyobinson, Patrick, MD    No Known Allergies  No family history on file.  Social History Social History   Tobacco Use  . Smoking status: Never Smoker  . Smokeless tobacco: Never Used  Substance Use Topics  . Alcohol use: No  . Drug use: Not on file    Review of Systems Constitutional: Negative for fever. Eyes: Negative for visual changes. ENT: Negative for congestion Cardiovascular: Negative for chest  pain. Respiratory: Negative for shortness of breath. Gastrointestinal: Positive for lower abdominal/pelvic discomfort.  Negative for nausea vomiting or diarrhea Genitourinary: Negative for dysuria. Musculoskeletal: Negative for back pain. Skin: Negative for rash. Neurological: Negative for headache All other ROS negative  ____________________________________________   PHYSICAL EXAM:  VITAL SIGNS: ED Triage Vitals  Enc Vitals Group     BP 02/07/17 1315 133/75     Pulse Rate 02/07/17 1315 92     Resp 02/07/17 1315 16     Temp 02/07/17 1315 97.9 F (36.6 C)     Temp Source 02/07/17 1315 Oral     SpO2 02/07/17 1315 100 %     Weight 02/07/17 1316 297 lb (134.7 kg)     Height 02/07/17 1316 5\' 2"  (1.575 m)     Head Circumference --      Peak Flow --      Pain Score 02/07/17 1324 9     Pain Loc --      Pain Edu? --      Excl. in GC? --    Constitutional: Alert and oriented. Well appearing and in no distress. Eyes: Normal exam ENT   Head: Normocephalic and atraumatic.   Mouth/Throat: Mucous membranes are moist. Cardiovascular: Normal rate, regular rhythm. No murmur Respiratory: Normal respiratory effort without tachypnea nor retractions. Breath sounds are clear  Gastrointestinal: Obese, soft, very mild suprapubic and left lower quadrant tenderness palpation.  No rebound or guarding.  No distention. Musculoskeletal: Nontender with normal range  of motion in all extremities.  Neurologic:  Normal speech and language. No gross focal neurologic deficits  Skin:  Skin is warm, dry and intact.  Psychiatric: Mood and affect are normal.   ____________________________________________     RADIOLOGY  Ultrasound pending  ____________________________________________   INITIAL IMPRESSION / ASSESSMENT AND PLAN / ED COURSE  Pertinent labs & imaging results that were available during my care of the patient were reviewed by me and considered in my medical decision making (see chart  for details).  Patient presents to the emergency department for lower abdominal pain for the past 2 days.  Differential is quite broad but would include ovarian cyst, hemorrhagic cyst, pelvic infection, urinary tract infection, colitis or diverticulitis.  We will check labs, perform a pelvic examination and continue to closely monitor in the emergency department.  Patient's labs have resulted largely within normal limits.  Pelvic exam shows normal amount of white discharge.  No significant tenderness on exam.  Given the patient's lower abdominal/pelvic pain however will obtain an ultrasound to further evaluate.  Given the patient's overall normal labs, largely normal vitals, I discussed with the patient holding off on a CT scan at this time given the risk of radiation.  Patient agrees and does not wish for CT currently.  Ultrasound pending, patient care signed out to Dr. Alphonzo Lemmings. ____________________________________________   FINAL CLINICAL IMPRESSION(S) / ED DIAGNOSES  LLQ pain Pelvic pain    Minna Antis, MD 02/07/17 1439

## 2018-12-23 IMAGING — US US ART/VEN ABD/PELV/SCROTUM DOPPLER LTD
1 series · 13 of 25 positions shown · non-contrast
Comparison: 06/29/2016

CLINICAL DATA: Lower abdominal pain. Clinical concern for ovarian
torsion.

EXAM:
TRANSABDOMINAL AND TRANSVAGINAL ULTRASOUND OF PELVIS
DOPPLER ULTRASOUND OF OVARIES
TECHNIQUE: Both transabdominal and transvaginal ultrasound examinations of the
pelvis were performed. Transabdominal technique was performed for
global imaging of the pelvis including uterus, ovaries, adnexal
regions, and pelvic cul-de-sac. Color and duplex Doppler ultrasound
was utilized to evaluate blood flow to the ovaries.

[Series 1: us art/ven abd/pelv/scrotum doppler ltd · 0.24mm/px · 13 of 85 slices shown]
[im 1/85]
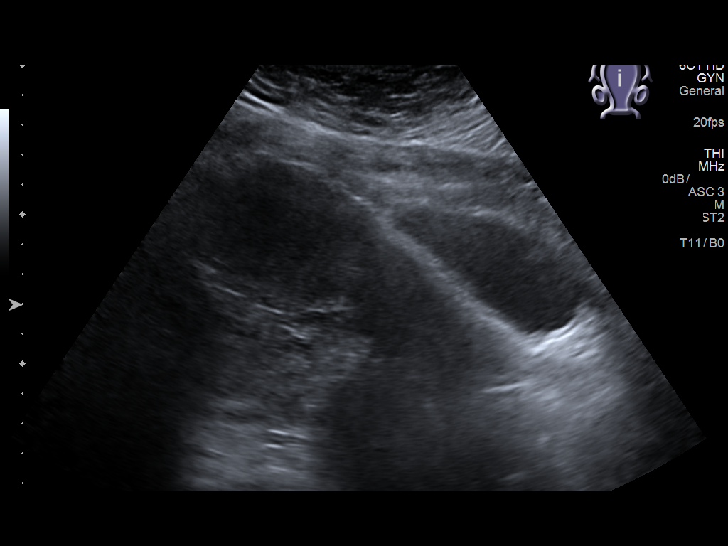
[im 8/85]
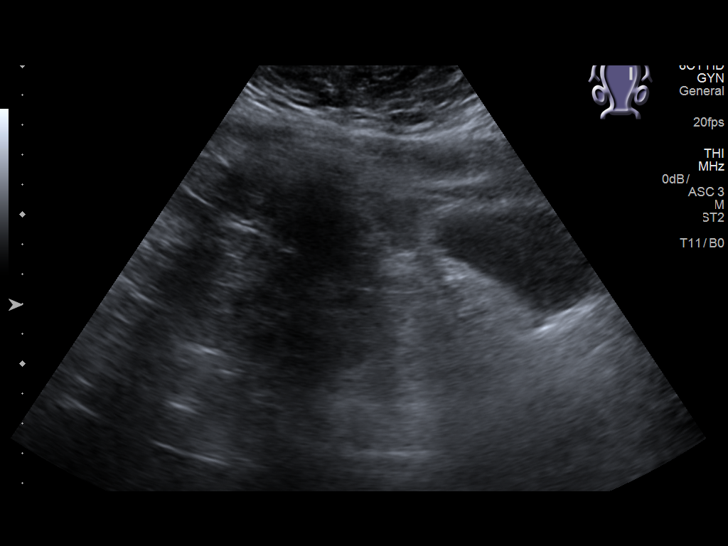
[im 15/85]
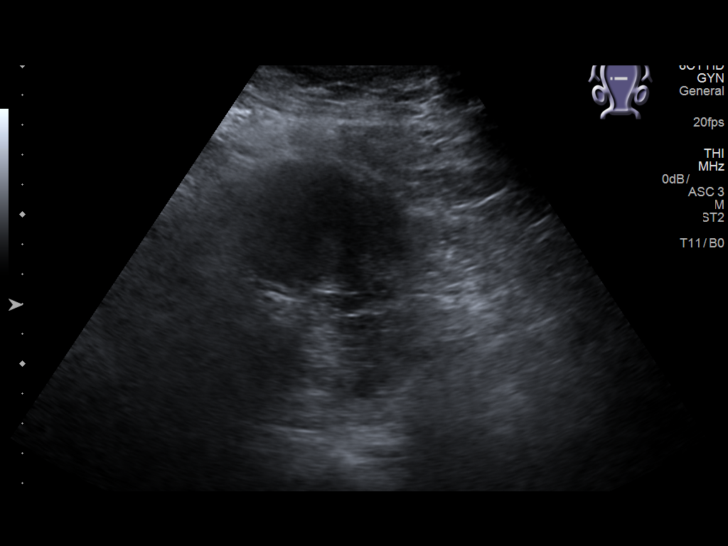
[im 22/85]
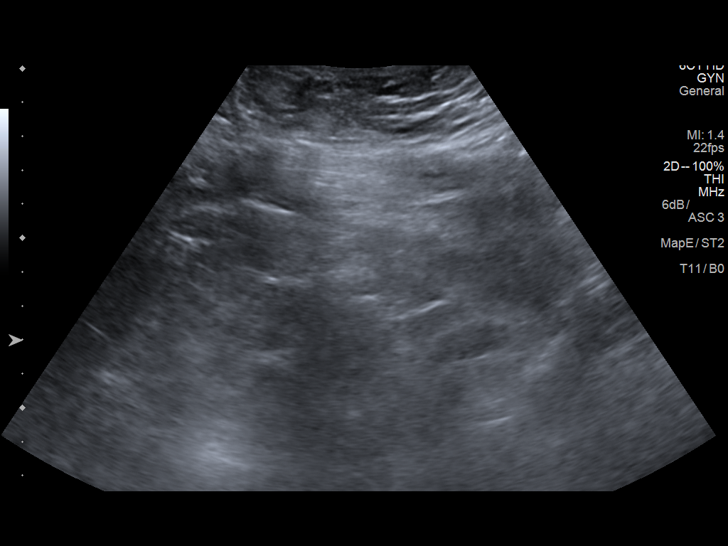
[im 29/85]
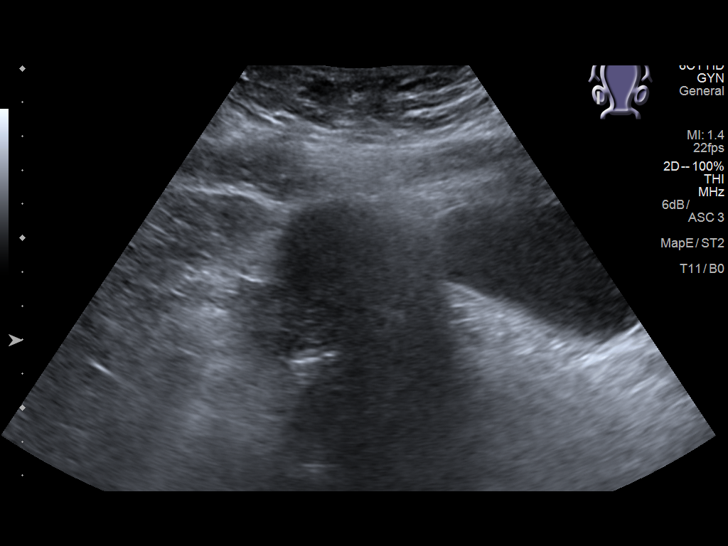
[im 36/85]
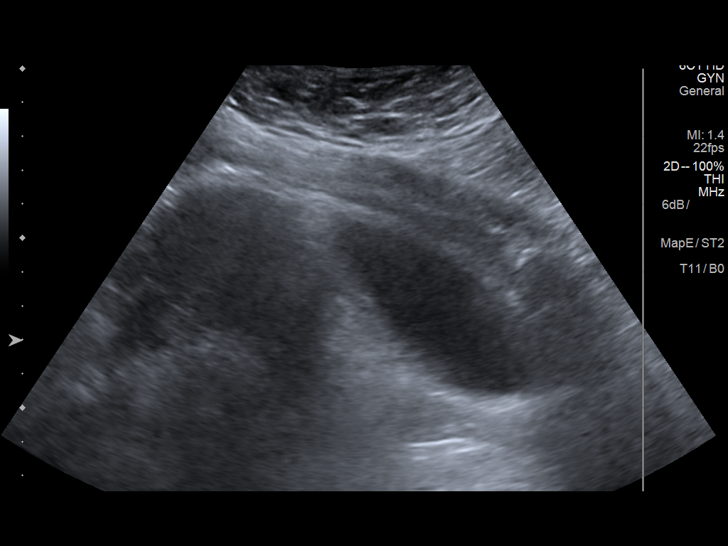
[im 43/85]
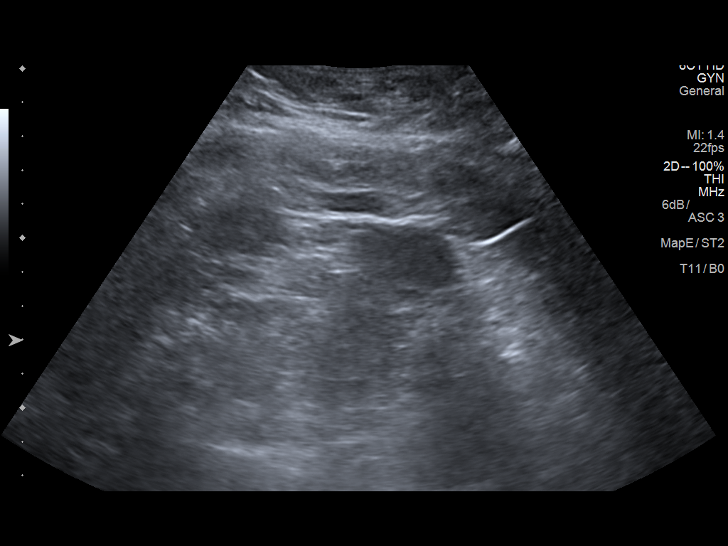
[im 50/85]
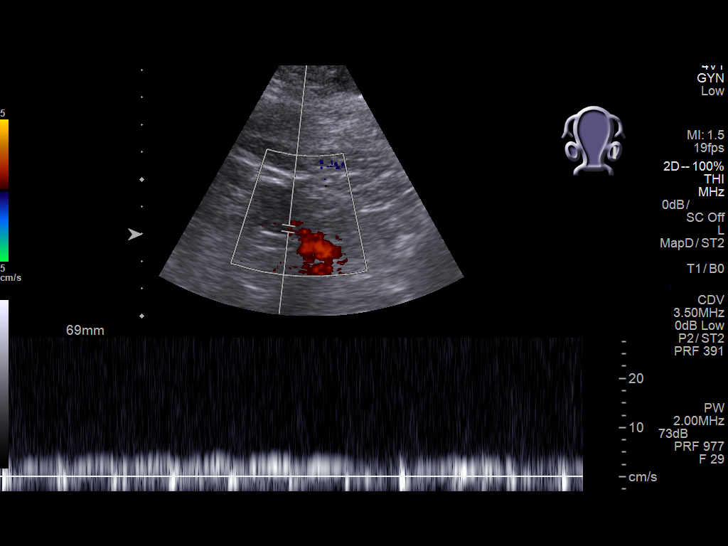
[im 57/85]
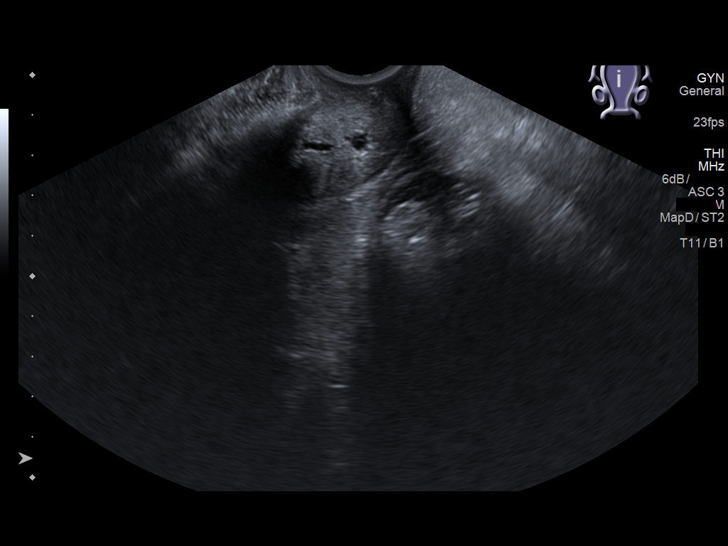
[im 64/85]
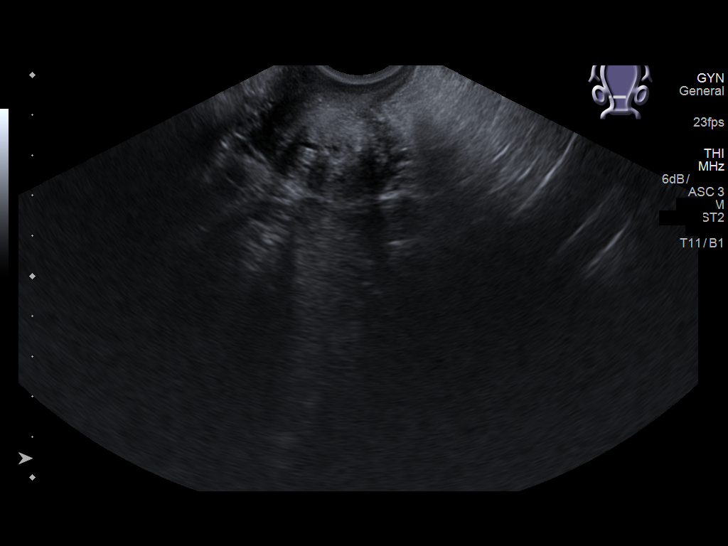
[im 71/85]
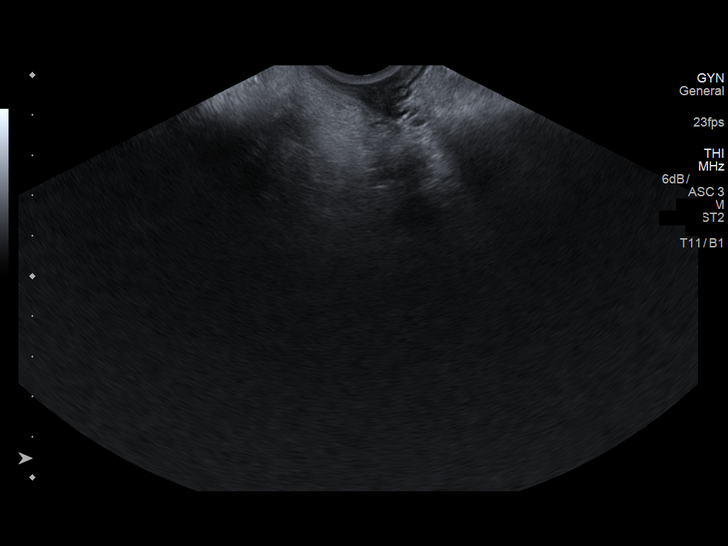
[im 78/85]
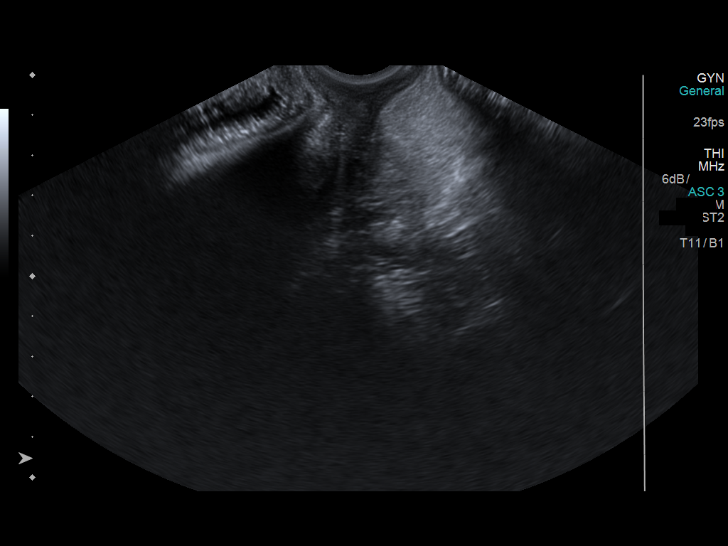
[im 85/85]
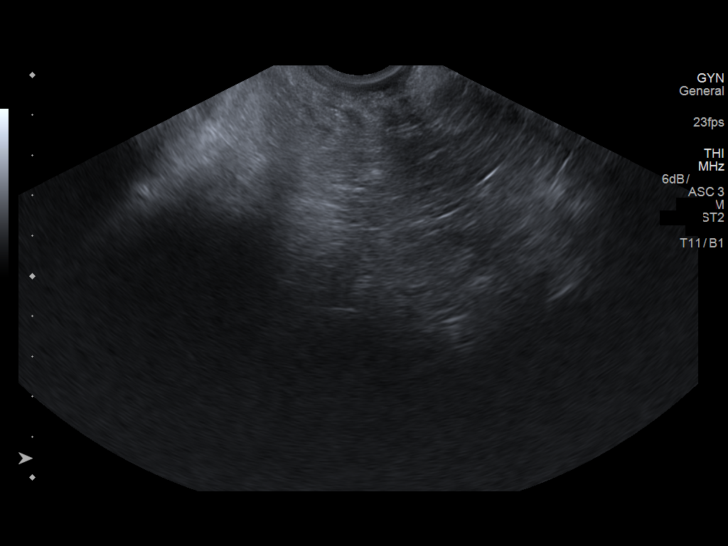

[13 of 25 positions shown; findings below may reference images not displayed]

FINDINGS: Uterus

Measurements: 10.8 x 4.6 x 5.4 cm. No fibroids or other mass
visualized.

Endometrium

Thickness: 5 mm transabdominally.  No focal abnormality visualized.

Right ovary

Measurements: 3.2 x 1.8 x 2.4 cm. Normal appearance/no adnexal mass.

Left ovary

Measurements: 3.1 x 1.7 x 2.3 cm. Normal appearance/no adnexal mass.

Pulsed Doppler evaluation of both ovaries demonstrates normal
low-resistance arterial and venous waveforms.

Other findings

No abnormal free fluid.

Transvaginal sonography was suboptimal due to patient habitus
inability to tolerate the transvaginal probe.
IMPRESSION: Normal appearance of uterus and ovaries. No pelvic mass or other
significant abnormality identified.

No sonographic evidence for ovarian torsion.
